# Patient Record
Sex: Male | Born: 1980 | Race: White | Hispanic: No | Marital: Married | Smoking: Current every day smoker
Health system: Southern US, Community
[De-identification: ages and names within clinical notes are randomized; demographics above are authoritative.]

---

## 2009-03-14 ENCOUNTER — Emergency Department: Payer: Self-pay | Admitting: Emergency Medicine

## 2011-06-29 ENCOUNTER — Emergency Department: Payer: Self-pay | Admitting: Internal Medicine

## 2013-02-12 ENCOUNTER — Emergency Department: Payer: Self-pay | Admitting: Emergency Medicine

## 2014-06-13 IMAGING — CR LEFT WRIST - COMPLETE 3+ VIEW
1 series · 4 of 4 positions shown · non-contrast
Comparison: none

REASON FOR EXAM: painful, injured sledding
COMMENTS:

PROCEDURE:     DXR - DXR WRIST LT COMP WITH OBLIQUES  - February 12, 2013  [DATE]
RESULT:     Left wrist images demonstrate no fracture, dislocation or
radiopaque foreign body.

[Series 1: pa · 0.17mm/px · 4 of 4 slices shown]
[im 1/4]
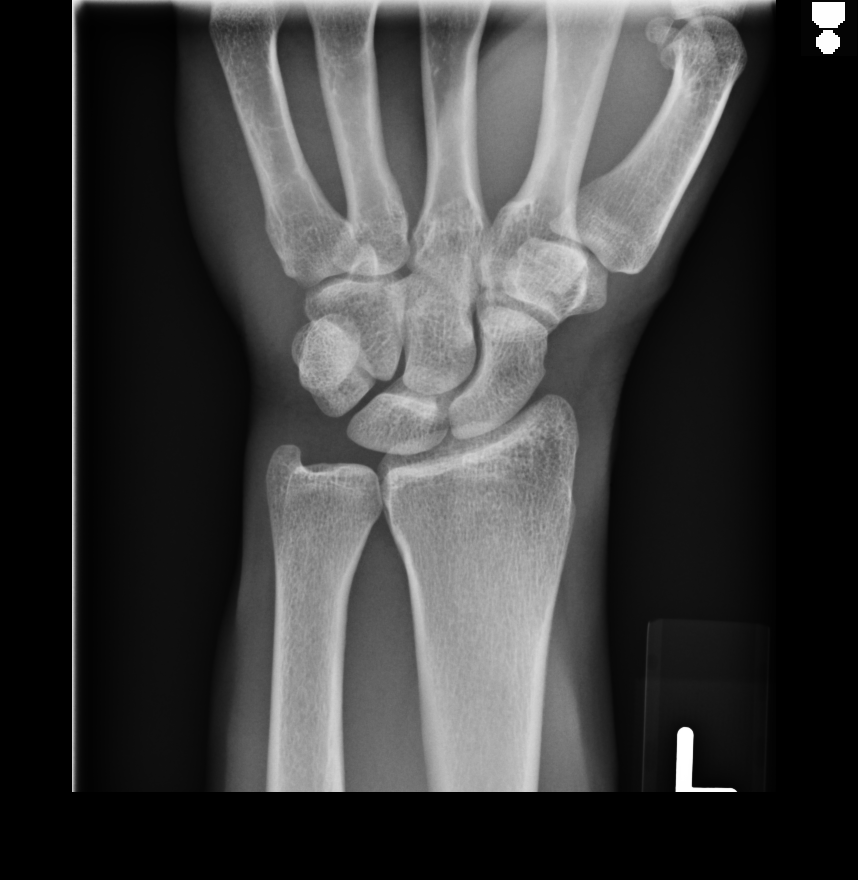
[im 2/4]
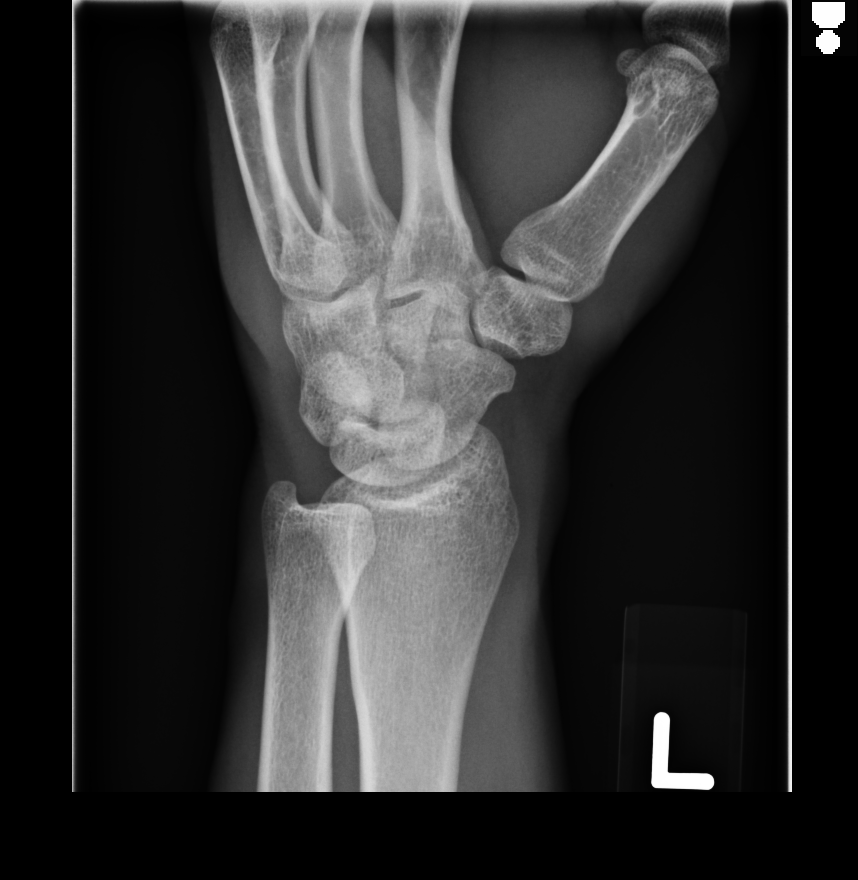
[im 3/4]
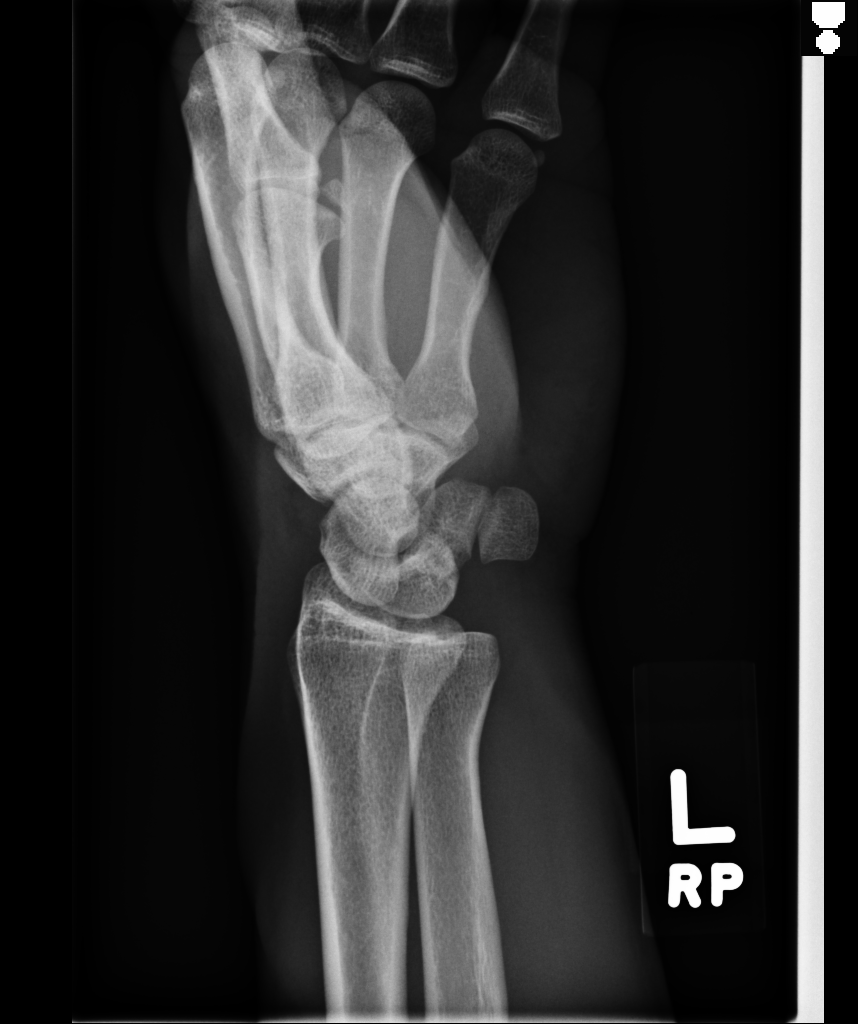
[im 4/4]
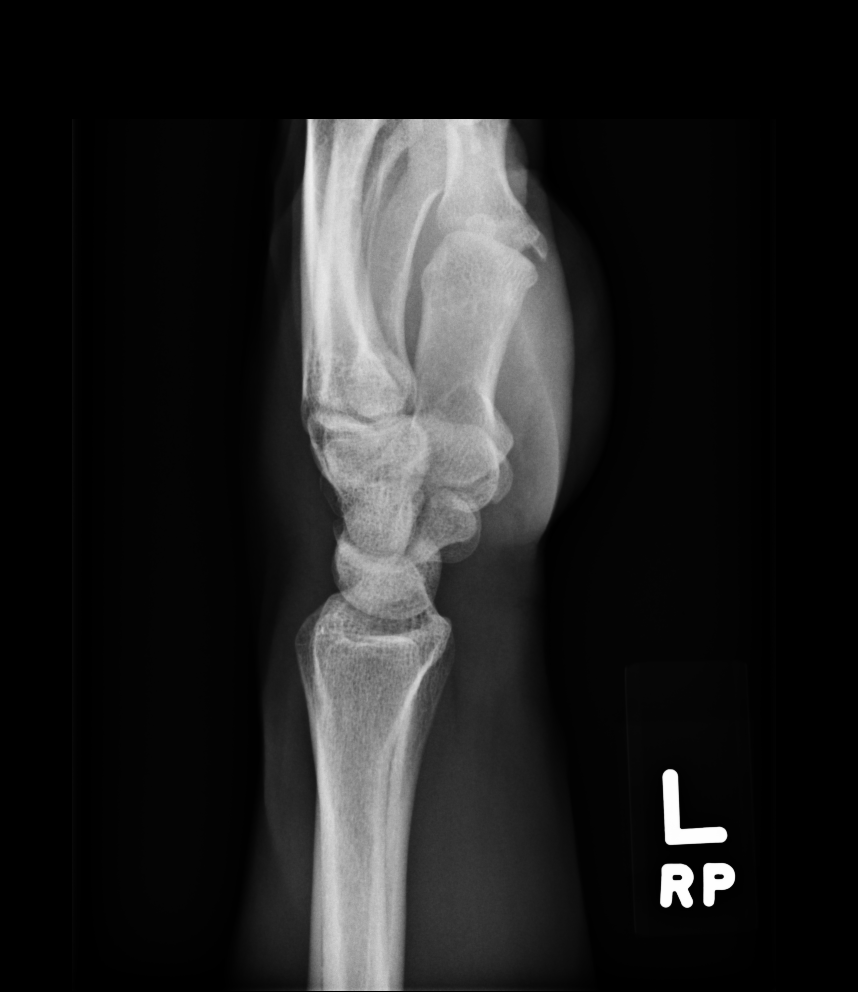

[4 of 4 positions shown; findings below may reference images not displayed]

IMPRESSION: Please see above.

[REDACTED]

## 2019-09-26 ENCOUNTER — Other Ambulatory Visit: Payer: Self-pay

## 2019-09-26 ENCOUNTER — Emergency Department
Admission: EM | Admit: 2019-09-26 | Discharge: 2019-09-26 | Disposition: A | Discharge status: Home / Self Care | Payer: Self-pay | Attending: Emergency Medicine | Admitting: Emergency Medicine

## 2019-09-26 ENCOUNTER — Encounter: Payer: Self-pay | Admitting: Emergency Medicine

## 2019-09-26 DIAGNOSIS — Y9389 Activity, other specified: Secondary | ICD-10-CM | POA: Insufficient documentation

## 2019-09-26 DIAGNOSIS — Y92007 Garden or yard of unspecified non-institutional (private) residence as the place of occurrence of the external cause: Secondary | ICD-10-CM | POA: Insufficient documentation

## 2019-09-26 DIAGNOSIS — W5911XA Bitten by nonvenomous snake, initial encounter: Secondary | ICD-10-CM | POA: Insufficient documentation

## 2019-09-26 DIAGNOSIS — S91032A Puncture wound without foreign body, left ankle, initial encounter: Secondary | ICD-10-CM | POA: Insufficient documentation

## 2019-09-26 DIAGNOSIS — Y999 Unspecified external cause status: Secondary | ICD-10-CM | POA: Insufficient documentation

## 2019-09-26 NOTE — Discharge Instructions (Signed)
Please seek medical attention for any high fevers, chest pain, shortness of breath, change in behavior, persistent vomiting, bloody stool or any other new or concerning symptoms.  

## 2019-09-26 NOTE — ED Provider Notes (Signed)
[facility] Emergency Department Provider Note   ____________________________________________   I have reviewed the triage vital signs and the nursing notes.   HISTORY  Chief Complaint Animal Bite   History limited by: Not Limited   HPI Dennis Garner. is a 38 y.o. male who presents to the emergency department today because of concern for snake bite. He says it happened roughly 1 hour prior to my evaluation. [place] he felt a sharp pain to his left ankle. When he looked down he saw a copperhead. The patient denies any current pain but felt like he had some stiffness in his ankle. Denies any history of heart or lung disease.  Records reviewed. Per medical record review patient has a history of tobacco use.  History reviewed. No pertinent past medical history.  There are no active problems to display for this patient.   History reviewed. No pertinent surgical history.  Prior to Admission medications   Not on File    Allergies Patient has no known allergies.  No family history on file.  Social History Social History   Tobacco Use  . Smoking status: Not on file  Substance Use Topics  . Alcohol use: Not on file  . Drug use: Not on file    Review of Systems Constitutional: No fever/chills Eyes: No visual changes. ENT: No sore throat. Cardiovascular: Denies chest pain. Respiratory: Denies shortness of breath. Gastrointestinal: No abdominal pain.  No nausea, no vomiting.  No diarrhea.   Genitourinary: Negative for dysuria. Musculoskeletal: Positive for some stiffness to the left ankle.  Skin: Positive for puncture to left ankle. Neurological: Negative for headaches, focal weakness or numbness.  ____________________________________________   PHYSICAL EXAM:  VITAL SIGNS: ED Triage Vitals [09/26/19 1520]  Enc Vitals Group     BP 132/81     Pulse      Resp      Temp 98.5     Temp src      SpO2     Weight 160 lb (72.6 kg)     Height 5\' 8"  (1.727 m)     Head Circumference      Peak Flow      Pain Score 2   Constitutional: Alert and oriented.  Eyes: Conjunctivae are normal.  ENT      Head: Normocephalic and atraumatic.      Nose: No congestion/rhinnorhea.      Mouth/Throat: Mucous membranes are moist.      Neck: No stridor. Cardiovascular: Normal rate, regular rhythm.  No murmurs, rubs, or gallops. Respiratory: Normal respiratory effort without tachypnea nor retractions. Breath sounds are clear and equal bilaterally. No wheezes/rales/rhonchi. Gastrointestinal: Soft and non tender. No rebound. No guarding.  Genitourinary: Deferred Musculoskeletal: Normal range of motion in all extremities. No lower extremity edema. Neurologic:  Normal speech and language. No gross focal neurologic deficits are appreciated.  Skin:  Single small puncture wound noted just distal to left lateral malleolus. No swelling.  Psychiatric: Mood and affect are normal. Speech and behavior are normal. Patient exhibits appropriate insight and judgment.  ____________________________________________    LABS (pertinent positives/negatives)  None  ____________________________________________   EKG  None  ____________________________________________    RADIOLOGY  None  ____________________________________________   PROCEDURES  Procedures  ____________________________________________   INITIAL IMPRESSION / ASSESSMENT AND PLAN / ED COURSE  Pertinent labs & imaging results that were available during my care of the patient were reviewed by me and considered  in my medical decision making (see chart for details).   Patient presented to the emergency department today after a snakebite.  Patient states that he saw a copperhead.  Patient was observed for a couple of hours after the bite without any significant swelling.  Patient without any significant pain.  This point do think likely a dry bite.   Patient stated he would like to go home and I think that is reasonable.  Did discuss return precautions.  ____________________________________________   FINAL CLINICAL IMPRESSION(S) / ED DIAGNOSES  Final diagnoses:  Snake bite, initial encounter     Note: This dictation was prepared with Dragon dictation. Any transcriptional errors that result from this process are unintentional     Nance Pear, MD 09/26/19 1642

## 2019-09-26 NOTE — ED Triage Notes (Signed)
Pt to ED via POV c/o snake bit to the left ankle. Pt states happened about 30 minutes PTA. One puncture wound noted on left ankle. No redness or swelling noted. Pt has minimal pain. Pt is in NAD.

## 2020-08-04 ENCOUNTER — Ambulatory Visit
Admission: EM | Admit: 2020-08-04 | Discharge: 2020-08-04 | Disposition: A | Discharge status: Home / Self Care | Payer: Self-pay | Attending: Family Medicine | Admitting: Family Medicine

## 2020-08-04 ENCOUNTER — Encounter: Payer: Self-pay | Admitting: Emergency Medicine

## 2020-08-04 ENCOUNTER — Other Ambulatory Visit: Payer: Self-pay

## 2020-08-04 ENCOUNTER — Ambulatory Visit (INDEPENDENT_AMBULATORY_CARE_PROVIDER_SITE_OTHER): Payer: Self-pay

## 2020-08-04 DIAGNOSIS — T148XXA Other injury of unspecified body region, initial encounter: Secondary | ICD-10-CM

## 2020-08-04 DIAGNOSIS — M25511 Pain in right shoulder: Secondary | ICD-10-CM

## 2020-08-04 MED ORDER — NAPROXEN 500 MG PO TABS: 500.0000 mg | ORAL_TABLET | Freq: Two times a day (BID) | ORAL | 0 refills | Status: AC

## 2020-08-04 MED ORDER — TRAMADOL HCL 50 MG PO TABS: 50.0000 mg | ORAL_TABLET | Freq: Three times a day (TID) | ORAL | 0 refills | Status: AC | PRN
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
## 2020-08-04 NOTE — Discharge Instructions (Signed)
Your xray is normal today which is reassuring.  You can very well have tendon/ligamentous injury which couldn't be visualized on x-ray, however.  Ice to the shoulder, especially after use.  Activity as tolerated, see range of motion exercises provided.  Naproxen twice a day, take with food.  Tramadol for breakthrough pain. May cause drowsiness. Please do not take if driving or drinking alcohol.   Cleanse wounds daily with soap and water until healed.  Follow up with orthopedics if pain persists as you may need additional imaging.
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
## 2024-09-02 ENCOUNTER — Emergency Department
Admission: EM | Admit: 2024-09-02 | Discharge: 2024-09-02 | Disposition: A | Discharge status: Home / Self Care | Payer: Self-pay | Attending: Emergency Medicine | Admitting: Emergency Medicine

## 2024-09-02 ENCOUNTER — Other Ambulatory Visit: Payer: Self-pay

## 2024-09-02 ENCOUNTER — Emergency Department: Payer: Self-pay

## 2024-09-02 DIAGNOSIS — R051 Acute cough: Secondary | ICD-10-CM | POA: Insufficient documentation

## 2024-09-02 DIAGNOSIS — Z87891 Personal history of nicotine dependence: Secondary | ICD-10-CM | POA: Insufficient documentation

## 2024-09-02 LAB — BASIC METABOLIC PANEL WITH GFR
Anion gap: 12 (ref 5–15)
BUN: 14 mg/dL (ref 6–20)
CO2: 26 mmol/L (ref 22–32)
Calcium: 9.4 mg/dL (ref 8.9–10.3)
Chloride: 101 mmol/L (ref 98–111)
Creatinine, Ser: 0.77 mg/dL (ref 0.61–1.24)
GFR, Estimated: >60 mL/min (ref >60)
Glucose, Bld: 98 mg/dL (ref 70–99)
Potassium: 4.3 mmol/L (ref 3.5–5.1)
Sodium: 139 mmol/L (ref 135–145)

## 2024-09-02 LAB — RESP PANEL BY RT-PCR (RSV, FLU A&B, COVID)  RVPGX2
Influenza A by PCR: NEGATIVE
Influenza B by PCR: NEGATIVE
Resp Syncytial Virus by PCR: NEGATIVE
SARS Coronavirus 2 by RT PCR: NEGATIVE

## 2024-09-02 LAB — CBC
HCT: 41.5 % (ref 39.0–52.0)
Hemoglobin: 14.3 g/dL (ref 13.0–17.0)
MCH: 33.3 pg (ref 26.0–34.0)
MCHC: 34.5 g/dL (ref 30.0–36.0)
MCV: 96.7 fL (ref 80.0–100.0)
Platelets: 336 K/uL (ref 150–400)
RBC: 4.29 MIL/uL (ref 4.22–5.81)
RDW: 12.4 % (ref 11.5–15.5)
WBC: 7.2 K/uL (ref 4.0–10.5)
nRBC: 0 % (ref 0.0–0.2)

## 2024-09-02 LAB — TROPONIN I (HIGH SENSITIVITY): Troponin I (High Sensitivity): 7 ng/L (ref <18)

## 2024-09-02 MED ORDER — DOXYCYCLINE HYCLATE 100 MG PO TABS: 100.0000 mg | ORAL_TABLET | Freq: Two times a day (BID) | ORAL | 0 refills | Status: AC

## 2024-09-02 MED ORDER — BENZONATATE 100 MG PO CAPS: 100.0000 mg | ORAL_CAPSULE | Freq: Three times a day (TID) | ORAL | 0 refills | Status: AC | PRN

## 2024-09-02 MED ORDER — AMOXICILLIN-POT CLAVULANATE 875-125 MG PO TABS: 1.0000 | ORAL_TABLET | Freq: Two times a day (BID) | ORAL | 0 refills | Status: AC

## 2024-09-02 NOTE — Discharge Instructions (Addendum)
 We are covering you with antibiotics in case there is a bacterial infection but your workup today was overall reassuring however your blood pressure is elevated and you need to have this rechecked with your primary care doctor.  Return to the ER for fevers, worsening symptoms or any other concern

## 2024-09-02 NOTE — ED Triage Notes (Signed)
 SOB, productive cough and chest congestion x 2 weeks. PT states feels like he is getting pneumonia.

## 2024-09-02 NOTE — ED Provider Notes (Addendum)
 [facility] Provider Note    Event Date/Time   First MD Initiated Contact with Patient 09/02/24 [phone]     (approximate)   History   Shortness of Breath   HPI  Dennis Garner. is a 43 y.o. male who is otherwise healthy does report a remote history of smoking but no longer who comes in with cough, congestion.  Patient reports that feeling sick for the past 2 weeks.  Reports initially some left ear pain but now going into his chest with cough that is yellowish in nature.  Denies any risk factors for pulmonary embolism.  He reports just feeling like he has pneumonia.  Denies any abdominal pain, leg swelling.   Physical Exam   Triage Vital Signs: ED Triage Vitals  Encounter Vitals Group     BP 09/02/24 0823 (!) 176/109     Girls Systolic BP Percentile --      Girls Diastolic BP Percentile --      Boys Systolic BP Percentile --      Boys Diastolic BP Percentile --      Pulse Rate 09/02/24 0823 99     Resp 09/02/24 0823 18     Temp 09/02/24 0823 97.8 F (36.6 C)     Temp Source 09/02/24 0823 Oral     SpO2 09/02/24 0823 95 %     Weight 09/02/24 0824 165 lb (74.8 kg)     Height 09/02/24 0824 5' 8 (1.727 m)     Head Circumference --      Peak Flow --      Pain Score 09/02/24 0824 1     Pain Loc --      Pain Education --      Exclude from Growth Chart --     Most recent vital signs: Vitals:   09/02/24 0823  BP: (!) 176/109  Pulse: 99  Resp: 18  Temp: 97.8 F (36.6 C)  SpO2: 95%     General: Awake, no distress.  CV:  Good peripheral perfusion.  Resp:  Normal effort.  No wheezing Abd:  No distention.  Soft and nontender Other:  No swelling in legs.  No calf tenderness TM is clear  ED Results / Procedures / Treatments   Labs (all labs ordered are listed, but only abnormal results are displayed) Labs Reviewed  BASIC METABOLIC PANEL WITH GFR  CBC     EKG  My interpretation of EKG:  Normal sinus rhythm 90 without any ST  elevation or T wave inversions, normal intervals  RADIOLOGY I have reviewed the xray personally and interpreted no evidence of any pneumonia   PROCEDURES:  Critical Care performed: No  Procedures   MEDICATIONS ORDERED IN ED: Medications - No data to display   IMPRESSION / MDM / ASSESSMENT AND PLAN / ED COURSE  I reviewed the triage vital signs and the nursing notes.   Patient's presentation is most consistent with acute presentation with potential threat to life or bodily function.   Patient's description sounds most consistent with pneumonia but could be viral in nature will test for COVID.  Chest x-ray to evaluate for any pneumonia, pneumothorax.  Patient is hypertensive so we will get blood work to make sure no elevation of kidney function.  Will get EKG, troponin to evaluate for ACS although this seems less likely considered pulmonary embolism but patient is PERC negative.  CBC reassuring.  Troponin is negative and the symptoms been ongoing for some time now.  BMP normal creatinine.  COVID and flu are negative.  This could be viral but given patient reports symptoms for 2 weeks now we will trial some antibiotics in case this could be a pneumonia and missed on x-ray.  Discussed with patient he felt comfortable with this plan.  Will cover with atypicals and Augmentin .  Discussed with patient his blood pressure and recheck with primary care doctor.  He reports that he typically does not run this high and it could just be from him feeling sick.  He would prefer to follow-up outpatient for blood pressure check.   The patient is on the cardiac monitor to evaluate for evidence of arrhythmia and/or significant heart rate changes.      FINAL CLINICAL IMPRESSION(S) / ED DIAGNOSES   Final diagnoses:  Acute cough     Rx / DC Orders   ED Discharge Orders          Ordered    doxycycline  (VIBRA -TABS) 100 MG tablet  2 times daily        09/02/24 1043     amoxicillin -clavulanate (AUGMENTIN ) 875-125 MG tablet  2 times daily        09/02/24 1043    benzonatate  (TESSALON  PERLES) 100 MG capsule  3 times daily PRN        09/02/24 1043             Note:  This document was prepared using Dragon voice recognition software and may include unintentional dictation errors.   Ernest Ronal BRAVO, MD 09/02/24 1043    Ernest Ronal BRAVO, MD 09/02/24 [phone]
